# Patient Record
Sex: Female | Born: 1970 | Race: White | Hispanic: No | Marital: Married | Smoking: Never smoker
Health system: Southern US, Community
[De-identification: ages and names within clinical notes are randomized; demographics above are authoritative.]

## PROBLEM LIST (undated history)

## (undated) DIAGNOSIS — J302 Other seasonal allergic rhinitis: Secondary | ICD-10-CM

## (undated) DIAGNOSIS — R51 Headache: Secondary | ICD-10-CM

## (undated) HISTORY — PX: NO PAST SURGERIES: SHX2092

---

## 1998-05-26 ENCOUNTER — Other Ambulatory Visit: Admission: RE | Admit: 1998-05-26 | Discharge: 1998-05-26 | Payer: Self-pay | Admitting: Obstetrics and Gynecology

## 1999-05-31 ENCOUNTER — Other Ambulatory Visit: Admission: RE | Admit: 1999-05-31 | Discharge: 1999-05-31 | Payer: Self-pay | Admitting: Obstetrics and Gynecology

## 2000-06-06 ENCOUNTER — Other Ambulatory Visit: Admission: RE | Admit: 2000-06-06 | Discharge: 2000-06-06 | Payer: Self-pay | Admitting: Obstetrics and Gynecology

## 2001-06-07 ENCOUNTER — Other Ambulatory Visit: Admission: RE | Admit: 2001-06-07 | Discharge: 2001-06-07 | Payer: Self-pay | Admitting: Obstetrics and Gynecology

## 2008-04-10 ENCOUNTER — Ambulatory Visit (HOSPITAL_COMMUNITY): Admission: RE | Admit: 2008-04-10 | Discharge: 2008-04-10 | Payer: Self-pay | Admitting: Obstetrics and Gynecology

## 2008-07-03 ENCOUNTER — Ambulatory Visit (HOSPITAL_COMMUNITY): Admission: RE | Admit: 2008-07-03 | Discharge: 2008-07-03 | Payer: Self-pay | Admitting: Obstetrics and Gynecology

## 2010-10-09 IMAGING — RF DG HYSTEROGRAM
9 series · 9 of 9 positions shown · IV contrast (omnipaque)
Comparison: none

CLINICAL DATA: Status post Essure micro-insert placement for
contraception.  Evaluate micro-insert location and tubal patency.

HYSTEROSALPINGOGRAM
TECHNIQUE: Following cleansing of the cervix and vagina with
Betadine solution, a hysterosalpingogram was performed using a 5-
French hysterosalpingogram catheter and Omnipaque 300 contrast. The
patient tolerated the examination without difficulty.
Fluoroscopy time: 1.1 minutes

[Series 1: run · 1 of 1 slices shown (1 of 9)]
[im 1/1]
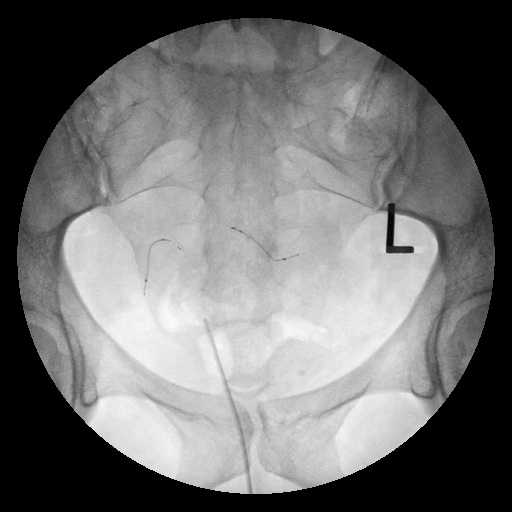

[Series 2: run · 1 of 1 slices shown (2 of 9)]
[im 1/1]
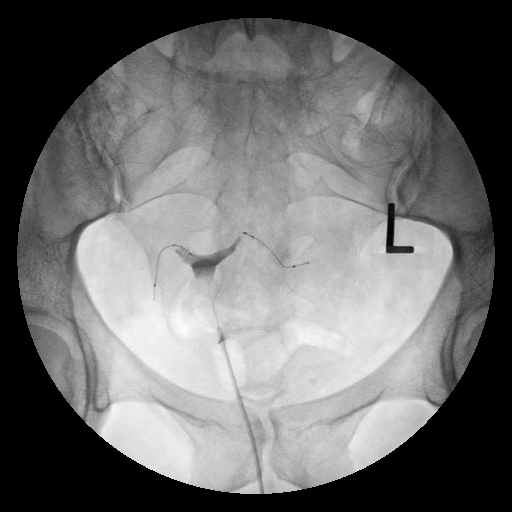

[Series 3: run · 1 of 1 slices shown (3 of 9)]
[im 1/1]
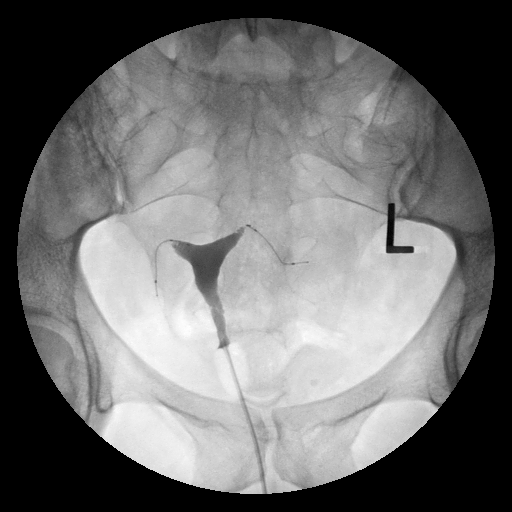

[Series 4: run · 1 of 1 slices shown (4 of 9)]
[im 1/1]
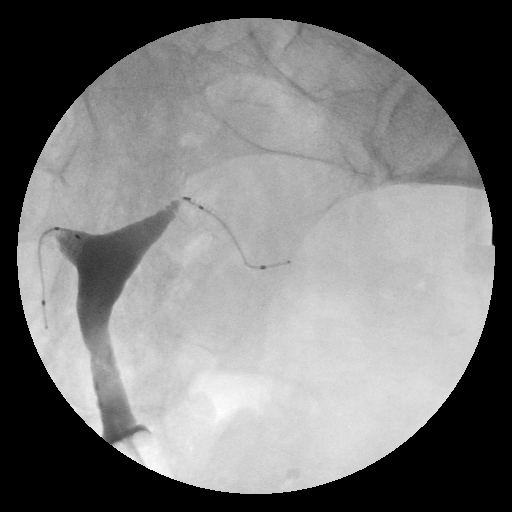

[Series 5: run · 1 of 1 slices shown (5 of 9)]
[im 1/1]
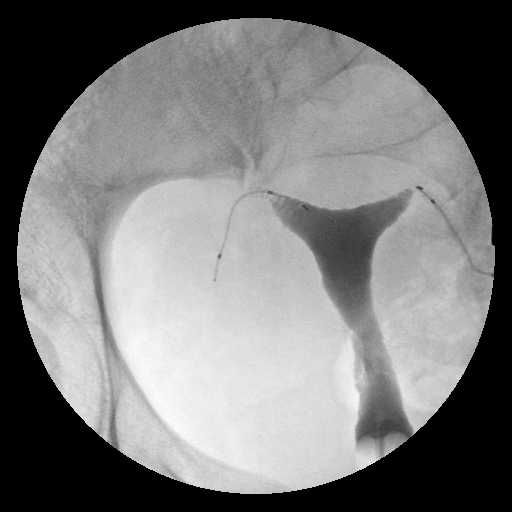

[Series 6: run · 1 of 1 slices shown (6 of 9)]
[im 1/1]
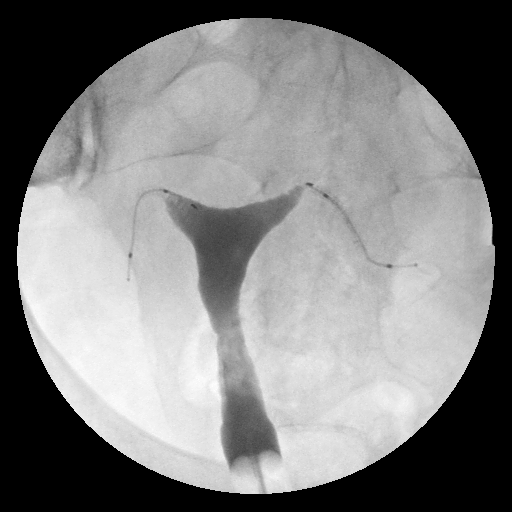

[Series 7: run · 1 of 1 slices shown (7 of 9)]
[im 1/1]
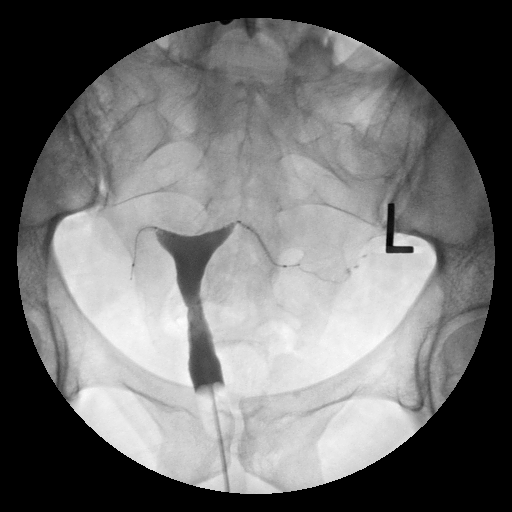

[Series 8: run · 1 of 1 slices shown (8 of 9)]
[im 1/1]
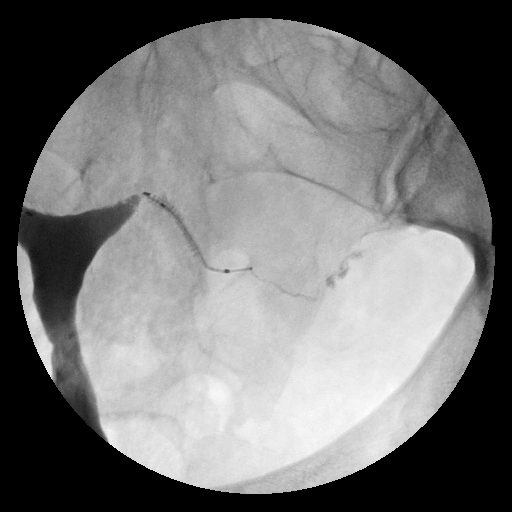

[Series 9: run · 1 of 1 slices shown (9 of 9)]
[im 1/1]
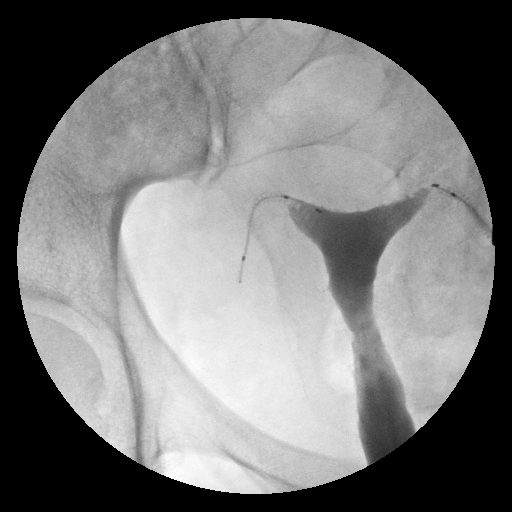

[9 of 9 positions shown; findings below may reference images not displayed]

FINDINGS: The Essure microinserts are seen in satisfactory
location within both of the fallopian tubes.

No contrast is seen opacifying the right fallopian tube.

Contrast opacification of the left fallopian tube is seen, with
contrast extending beyond the distal end of the micro-
insert(category 3).
IMPRESSION: 1.  Both Essure micro-inserts are in satisfactory location.
2.  Left fallopian tube is patent beyond the micro-insert (category
3).

This report was discussed with the secretary in Dr. [REDACTED] by telephone on 04/10/2008 at 5577 hours, as Dr. Katty
was not available, and this report was faxed to his office.

## 2010-12-21 ENCOUNTER — Other Ambulatory Visit: Payer: Self-pay

## 2010-12-21 ENCOUNTER — Encounter (HOSPITAL_BASED_OUTPATIENT_CLINIC_OR_DEPARTMENT_OTHER): Payer: Self-pay | Admitting: *Deleted

## 2010-12-21 ENCOUNTER — Inpatient Hospital Stay (HOSPITAL_BASED_OUTPATIENT_CLINIC_OR_DEPARTMENT_OTHER)
Admission: RE | Admit: 2010-12-21 | Discharge: 2010-12-21 | Disposition: A | Discharge status: Home / Self Care | Payer: BC Managed Care – PPO | Source: Ambulatory Visit

## 2010-12-21 LAB — BASIC METABOLIC PANEL
BUN: 5 mg/dL — ABNORMAL LOW (ref 6–23)
GFR calc Af Amer: >90 mL/min (ref >90)
GFR calc non Af Amer: >90 mL/min (ref >90)
Potassium: 3.5 mEq/L (ref 3.5–5.1)
Sodium: 139 mEq/L (ref 135–145)

## 2010-12-21 NOTE — Progress Notes (Signed)
Pt's PCP Deboraha Sprang at [facility]       [phone]

## 2010-12-22 ENCOUNTER — Other Ambulatory Visit: Payer: Self-pay | Admitting: Surgery

## 2010-12-22 NOTE — H&P (Addendum)
  RE: Carrie Humphrey, Carrie Humphrey   [id number]      DOB: 09-Dec-1970 PROGRESS NOTE: 11-09-10 [name] is a 40 year old presents for evaluation and treatment recommendation for right ankle. She had a ankle fracture 10 years ago treated closed with a Cam Walker. She did well went on to heal and her ankle has done well. She's had intermittent mechanical symptoms laterally. Lately this has gotten worse to a point that she'll go into plantar flexion her foot will lock and she has to maneuver it to get it back with motion and allow it to dorsiflex. Symptoms are anterolateral. No giving way or swelling. In between episodes of locking she's done well without arthritic symptoms. No symptoms straight lateral or straight medial.  Remaining history is updated included in the chart. General exam is outlined included in the chart.   EXAMINATION: Healthy 40 year old 5'2" 150 pounds. Normal gait and stance. Right ankle has tenderness anterolaterally. You can feel thickened synovium or loose body anterolaterally with dorsiflexion and plantarflexion. A little sore over the syndesmosis. Does not open to tilt or drawer or with external rotation. No tenderness medially. No subluxation of her peroneal tendons laterally. Achilles intact. She's neurovascularly intact distally.  A little effusion. Left ankle has full motion good stability neurovascularly intact.  X-RAYS: Right ankle shows old ossification calcification at the medial malleolus from the soft tissue injury when she broke her ankle. No other evidence of disruption from previous fracture. Alignment is good mortise well maintained. There is an ossific loose body anterolaterally on the AP view it looks like it's almost into the syndesmosis but I think this is a free loose body within the ankle. I'm not seeing an obvious bed of osteochondral injury. No significant spurring.  IMPRESSION: Posttraumatic osteochondral loose body right ankle.   DISPOSITION: To delineate all structures in  the ankle and look for potential site of osteochondral injury that may require debridement and drilling I'm going to get an MRI. Sufficient symptoms to warrant treatment and we discussed that. All paperwork is complete all questions answered for exam under anesthesia arthroscopy with removal of loose body. I'll be in touch with her after MRI is complete to be sure I'm not seeing anything else that needs to be addressed.  Loreta Ave, M.D.  Patient seen and examined. No changes, proceed with Right ankle scope/debride/loose body/ possible microfx

## 2010-12-23 ENCOUNTER — Encounter (HOSPITAL_BASED_OUTPATIENT_CLINIC_OR_DEPARTMENT_OTHER): Payer: Self-pay | Admitting: *Deleted

## 2010-12-23 ENCOUNTER — Ambulatory Visit (HOSPITAL_BASED_OUTPATIENT_CLINIC_OR_DEPARTMENT_OTHER)
Admission: RE | Admit: 2010-12-23 | Discharge: 2010-12-23 | Disposition: A | Discharge status: Home / Self Care | Payer: BC Managed Care – PPO | Source: Ambulatory Visit | Attending: Orthopedic Surgery | Admitting: Orthopedic Surgery

## 2010-12-23 ENCOUNTER — Encounter (HOSPITAL_BASED_OUTPATIENT_CLINIC_OR_DEPARTMENT_OTHER)
Admission: RE | Disposition: A | Discharge status: Home / Self Care | Payer: Self-pay | Source: Ambulatory Visit | Attending: Orthopedic Surgery

## 2010-12-23 ENCOUNTER — Ambulatory Visit (HOSPITAL_BASED_OUTPATIENT_CLINIC_OR_DEPARTMENT_OTHER): Payer: BC Managed Care – PPO | Admitting: *Deleted

## 2010-12-23 DIAGNOSIS — M2408 Loose body, other site: Secondary | ICD-10-CM | POA: Insufficient documentation

## 2010-12-23 DIAGNOSIS — R51 Headache: Secondary | ICD-10-CM | POA: Insufficient documentation

## 2010-12-23 DIAGNOSIS — M24073 Loose body in unspecified ankle: Secondary | ICD-10-CM | POA: Insufficient documentation

## 2010-12-23 DIAGNOSIS — Z01812 Encounter for preprocedural laboratory examination: Secondary | ICD-10-CM | POA: Insufficient documentation

## 2010-12-23 DIAGNOSIS — Z01810 Encounter for preprocedural cardiovascular examination: Secondary | ICD-10-CM | POA: Insufficient documentation

## 2010-12-23 DIAGNOSIS — M959 Acquired deformity of musculoskeletal system, unspecified: Secondary | ICD-10-CM | POA: Insufficient documentation

## 2010-12-23 DIAGNOSIS — Z4789 Encounter for other orthopedic aftercare: Secondary | ICD-10-CM

## 2010-12-23 DIAGNOSIS — E119 Type 2 diabetes mellitus without complications: Secondary | ICD-10-CM | POA: Insufficient documentation

## 2010-12-23 HISTORY — PX: ANKLE ARTHROSCOPY: SHX545

## 2010-12-23 HISTORY — DX: Other seasonal allergic rhinitis: J30.2

## 2010-12-23 HISTORY — DX: Headache: R51

## 2010-12-23 SURGERY — ARTHROSCOPY, ANKLE
Anesthesia: General | Site: Ankle | Laterality: Right | Wound class: Clean

## 2010-12-23 MED ORDER — LACTATED RINGERS IV SOLN: INTRAVENOUS | Status: DC

## 2010-12-23 MED ORDER — ROPIVACAINE HCL 5 MG/ML IJ SOLN
INTRAMUSCULAR | Status: DC | PRN
  Administered 2010-12-23: 20 mL via EPIDURAL

## 2010-12-23 MED ORDER — ONDANSETRON HCL 4 MG/2ML IJ SOLN
INTRAMUSCULAR | Status: DC | PRN
  Administered 2010-12-23: 4 mg via INTRAVENOUS

## 2010-12-23 MED ORDER — MORPHINE SULFATE 2 MG/ML IJ SOLN: 0.0500 mg/kg | INTRAMUSCULAR | Status: DC | PRN

## 2010-12-23 MED ORDER — PROPOFOL 10 MG/ML IV EMUL
INTRAVENOUS | Status: DC | PRN
  Administered 2010-12-23: 200 mg via INTRAVENOUS

## 2010-12-23 MED ORDER — ATROPINE SULFATE 0.4 MG/ML IJ SOLN: 0.4000 mg | Freq: Once | INTRAMUSCULAR | Status: DC | PRN

## 2010-12-23 MED ORDER — VANCOMYCIN HCL IN DEXTROSE 1-5 GM/200ML-% IV SOLN
1000.0000 mg | INTRAVENOUS | Status: AC
  Administered 2010-12-23: 1000 mg via INTRAVENOUS

## 2010-12-23 MED ORDER — LIDOCAINE-PRILOCAINE 2.5-2.5 % EX CREA: 1.0000 "application " | TOPICAL_CREAM | Freq: Once | CUTANEOUS | Status: DC

## 2010-12-23 MED ORDER — FENTANYL CITRATE 0.05 MG/ML IJ SOLN
50.0000 ug | INTRAMUSCULAR | Status: DC | PRN
  Administered 2010-12-23: 100 ug via INTRAVENOUS

## 2010-12-23 MED ORDER — GLYCOPYRROLATE 0.2 MG/ML IJ SOLN: 0.2000 mg | Freq: Once | INTRAMUSCULAR | Status: DC | PRN

## 2010-12-23 MED ORDER — LIDOCAINE HCL (CARDIAC) 20 MG/ML IV SOLN
INTRAVENOUS | Status: DC | PRN
  Administered 2010-12-23: 25 mg via INTRAVENOUS
  Administered 2010-12-23: 1 mg via INTRAVENOUS

## 2010-12-23 MED ORDER — LACTATED RINGERS IV SOLN
INTRAVENOUS | Status: DC
  Administered 2010-12-23 (×2): via INTRAVENOUS

## 2010-12-23 MED ORDER — LACTATED RINGERS IV SOLN: 500.0000 mL | INTRAVENOUS | Status: DC

## 2010-12-23 MED ORDER — MIDAZOLAM HCL 2 MG/2ML IJ SOLN
0.5000 mg | INTRAMUSCULAR | Status: DC | PRN
  Administered 2010-12-23: 2 mg via INTRAVENOUS

## 2010-12-23 MED ORDER — METOCLOPRAMIDE HCL 5 MG/ML IJ SOLN: 10.0000 mg | Freq: Once | INTRAMUSCULAR | Status: DC | PRN

## 2010-12-23 MED ORDER — FENTANYL CITRATE 0.05 MG/ML IJ SOLN: 25.0000 ug | INTRAMUSCULAR | Status: DC | PRN

## 2010-12-23 MED ORDER — CEFAZOLIN SODIUM 1-5 GM-% IV SOLN: 1.0000 g | INTRAVENOUS | Status: DC

## 2010-12-23 MED ORDER — IBUPROFEN 200 MG PO TABS: 200.0000 mg | ORAL_TABLET | Freq: Four times a day (QID) | ORAL | Status: DC | PRN

## 2010-12-23 MED ORDER — KETOROLAC TROMETHAMINE 30 MG/ML IJ SOLN
15.0000 mg | Freq: Once | INTRAMUSCULAR | Status: AC | PRN
  Administered 2010-12-23: 30 mg via INTRAVENOUS

## 2010-12-23 MED ORDER — DEXAMETHASONE SODIUM PHOSPHATE 4 MG/ML IJ SOLN
INTRAMUSCULAR | Status: DC | PRN
  Administered 2010-12-23: 5 mg via INTRAVENOUS

## 2010-12-23 MED ORDER — MIDAZOLAM HCL 2 MG/2ML IJ SOLN: 1.0000 mg | INTRAMUSCULAR | Status: DC | PRN

## 2010-12-23 MED ORDER — LIDOCAINE-EPINEPHRINE 1.5-1:200000 % IJ SOLN
INTRAMUSCULAR | Status: DC | PRN
  Administered 2010-12-23: 20 mL via INTRADERMAL

## 2010-12-23 MED ORDER — OXYMETAZOLINE HCL 0.05 % NA SOLN: 2.0000 | Freq: Once | NASAL | Status: DC

## 2010-12-23 SURGICAL SUPPLY — 51 items
BANDAGE ELASTIC 4 VELCRO ST LF (GAUZE/BANDAGES/DRESSINGS) ×2 IMPLANT
BLADE 4.2CUDA (BLADE) IMPLANT
BLADE CUDA GRT WHITE 3.5 (BLADE) IMPLANT
BLADE CUDA SHAVER 3.5 (BLADE) ×1 IMPLANT
BLADE CUTTER GATOR 3.5 (BLADE) IMPLANT
BLADE GREAT WHITE 4.2 (BLADE) IMPLANT
BNDG CMPR 9X4 STRL LF SNTH (GAUZE/BANDAGES/DRESSINGS) ×1
BNDG ESMARK 4X9 LF (GAUZE/BANDAGES/DRESSINGS) ×2 IMPLANT
BUR CUDA 2.9 (BURR) IMPLANT
BUR FULL RADIUS 2.9 (BURR) ×1 IMPLANT
BUR OVAL 4.0 (BURR) IMPLANT
BUR OVAL 6.0 (BURR) IMPLANT
CANISTER OMNI JUG 16 LITER (MISCELLANEOUS) IMPLANT
CANISTER SUCTION 2500CC (MISCELLANEOUS) IMPLANT
CLOTH BEACON ORANGE TIMEOUT ST (SAFETY) ×2 IMPLANT
CUFF TOURNIQUET SINGLE 34IN LL (TOURNIQUET CUFF) IMPLANT
CUTTER MENISCUS  4.2MM (BLADE)
CUTTER MENISCUS 4.2MM (BLADE) IMPLANT
DECANTER SPIKE VIAL GLASS SM (MISCELLANEOUS) IMPLANT
DRAPE ARTHROSCOPY W/POUCH 90 (DRAPES) ×2 IMPLANT
DRAPE OEC MINIVIEW 54X84 (DRAPES) IMPLANT
DRAPE SURG 17X23 STRL (DRAPES) ×2 IMPLANT
DRSG EMULSION OIL 3X3 NADH (GAUZE/BANDAGES/DRESSINGS) ×1 IMPLANT
DURAPREP 26ML APPLICATOR (WOUND CARE) ×2 IMPLANT
ELECT MENISCUS 165MM 90D (ELECTRODE) IMPLANT
ELECT REM PT RETURN 9FT ADLT (ELECTROSURGICAL)
ELECTRODE REM PT RTRN 9FT ADLT (ELECTROSURGICAL) ×1 IMPLANT
GAUZE XEROFORM 1X8 LF (GAUZE/BANDAGES/DRESSINGS) ×2 IMPLANT
GLOVE BIOGEL PI IND STRL 8 (GLOVE) ×1 IMPLANT
GLOVE BIOGEL PI INDICATOR 8 (GLOVE) ×1
GLOVE ORTHO TXT STRL SZ7.5 (GLOVE) ×4 IMPLANT
GOWN BRE IMP PREV XXLGXLNG (GOWN DISPOSABLE) ×2 IMPLANT
GOWN PREVENTION PLUS XLARGE (GOWN DISPOSABLE) ×2 IMPLANT
IV NS IRRIG 3000ML ARTHROMATIC (IV SOLUTION) ×1 IMPLANT
NDL KEITH (NEEDLE) IMPLANT
NEEDLE KEITH (NEEDLE) IMPLANT
PACK ARTHROSCOPY DSU (CUSTOM PROCEDURE TRAY) ×2 IMPLANT
PACK BASIN DAY SURGERY FS (CUSTOM PROCEDURE TRAY) ×2 IMPLANT
PADDING WEBRIL 4 STERILE (GAUZE/BANDAGES/DRESSINGS) ×1 IMPLANT
PENCIL BUTTON HOLSTER BLD 10FT (ELECTRODE) IMPLANT
SET ARTHROSCOPY TUBING (MISCELLANEOUS) ×2
SET ARTHROSCOPY TUBING LN (MISCELLANEOUS) ×1 IMPLANT
SLEEVE SCD COMPRESS KNEE MED (MISCELLANEOUS) IMPLANT
SPONGE GAUZE 4X4 12PLY (GAUZE/BANDAGES/DRESSINGS) ×4 IMPLANT
STOCKINETTE 4X48 STRL (DRAPES) ×2 IMPLANT
STRAP ANKLE FOOT DISTRACTOR (ORTHOPEDIC SUPPLIES) IMPLANT
SUT ETHILON 3 0 PS 1 (SUTURE) ×2 IMPLANT
SUT VIC AB 3-0 SH 27 (SUTURE)
SUT VIC AB 3-0 SH 27X BRD (SUTURE) IMPLANT
TUBE CONNECTING 20X1/4 (TUBING) IMPLANT
WATER STERILE IRR 1000ML POUR (IV SOLUTION) ×2 IMPLANT

## 2010-12-23 NOTE — Anesthesia Preprocedure Evaluation (Addendum)
Anesthesia Evaluation  Patient identified by MRN, date of birth, ID band Patient awake    Reviewed: Allergy & Precautions, H&P , NPO status , Patient's Chart, lab work & pertinent test results, reviewed documented beta blocker date and time   Airway Mallampati: II TM Distance: >3 FB Neck ROM: full    Dental   Pulmonary neg pulmonary ROS,    Pulmonary exam normal       Cardiovascular neg cardio ROS     Neuro/Psych  Headaches, Negative Psych ROS   GI/Hepatic negative GI ROS, Neg liver ROS,   Endo/Other  Negative Endocrine ROSDiabetes mellitus-  Renal/GU negative Renal ROS  Genitourinary negative   Musculoskeletal   Abdominal   Peds  Hematology negative hematology ROS (+)   Anesthesia Other Findings   Reproductive/Obstetrics negative OB ROS                           Anesthesia Physical Anesthesia Plan  ASA: II  Anesthesia Plan: General   Post-op Pain Management: MAC Combined w/ Regional for Post-op pain   Induction: Intravenous  Airway Management Planned: LMA  Additional Equipment:   Intra-op Plan:   Post-operative Plan:   Informed Consent: I have reviewed the patients History and Physical, chart, labs and discussed the procedure including the risks, benefits and alternatives for the proposed anesthesia with the patient or authorized representative who has indicated his/her understanding and acceptance.   Dental Advisory Given  Plan Discussed with: CRNA and Surgeon  Anesthesia Plan Comments: (See surgeon's H&P )      Anesthesia Quick Evaluation

## 2010-12-23 NOTE — Brief Op Note (Signed)
12/23/2010  11:01 AM  PATIENT:  Carrie Humphrey  40 y.o. female  PRE-OPERATIVE DIAGNOSIS:  loose body right ankle  POST-OPERATIVE DIAGNOSIS:  loose body right ankle  PROCEDURE:  Procedure(s): ANKLE ARTHROSCOPY ANKLE ARTHROSCOPY WITH DRILLING/MICROFRACTURE  SURGEON:  Surgeon(s): Loreta Ave, MD  PHYSICIAN ASSISTANT: Zonia Kief M     ANESTHESIA:   general  EBL:  Total I/O In: 1200 [I.V.:1200] Out: -       LOCAL MEDICATIONS USED:  NONE  SPECIMEN:  No Specimen  DISPOSITION OF SPECIMEN:  N/A  COUNTS:  YES  TOURNIQUET:   Total Tourniquet Time Documented: Thigh (Right) - 43 minutes      PATIENT DISPOSITION:  PACU - hemodynamically stable.

## 2010-12-23 NOTE — Anesthesia Procedure Notes (Addendum)
Anesthesia Regional Block:  Popliteal block  Pre-Anesthetic Checklist: ,, timeout performed, Correct Patient, Correct Site, Correct Laterality, Correct Procedure, Correct Position, site marked, Risks and benefits discussed,  Surgical consent,  Pre-op evaluation,  At surgeon's request and post-op pain management  Laterality: Right  Prep: chloraprep       Needles:   Needle Type: Other   (Arrow Echogenic)   Needle Length: 9cm  Needle Gauge: 21    Additional Needles:  Procedures: ultrasound guided Popliteal block Narrative:  Start time: 12/23/2010 9:10 AM End time: 12/23/2010 9:20 AM Injection made incrementally with aspirations every 5 mL.  Performed by: Personally  Anesthesiologist: cfrederick  Additional Notes: Ultrasound guidance used to: id relevant anatomy, confirm needle position, local anesthetic spread, avoidance of vascular puncture. Picture saved. No complications.    Popliteal block Procedure Name: LMA Insertion Performed by: Meyer Russel Pre-anesthesia Checklist: Patient identified, Emergency Drugs available, Suction available and Patient being monitored Patient Re-evaluated:Patient Re-evaluated prior to inductionOxygen Delivery Method: Circle System Utilized Preoxygenation: Pre-oxygenation with 100% oxygen Intubation Type: IV induction Ventilation: Mask ventilation without difficulty LMA: LMA inserted LMA Size: 4.0 Number of attempts: 1 Airway Equipment and Method: bite block Placement Confirmation: positive ETCO2 Tube secured with: Tape Dental Injury: Teeth and Oropharynx as per pre-operative assessment     Procedure Name: LMA Insertion Date/Time: 12/23/2010 9:52 AM Performed by: Meyer Russel Pre-anesthesia Checklist: Patient identified, Emergency Drugs available, Suction available, Patient being monitored and Timeout performed Patient Re-evaluated:Patient Re-evaluated prior to inductionOxygen Delivery Method: Circle System  Utilized Preoxygenation: Pre-oxygenation with 100% oxygen Intubation Type: IV induction Ventilation: Mask ventilation without difficulty LMA: LMA inserted LMA Size: 3.0 Number of attempts: 1 Airway Equipment and Method: bite block Placement Confirmation: positive ETCO2 Tube secured with: Tape Dental Injury: Teeth and Oropharynx as per pre-operative assessment

## 2010-12-23 NOTE — Transfer of Care (Signed)
Immediate Anesthesia Transfer of Care Note  Patient: Carrie Humphrey  Procedure(s) Performed:  ANKLE ARTHROSCOPY; ANKLE ARTHROSCOPY WITH DRILLING/MICROFRACTURE  Patient Location: PACU  Anesthesia Type: GA combined with regional for post-op pain  Level of Consciousness: awake, alert  and patient cooperative  Airway & Oxygen Therapy: Patient Spontanous Breathing and Patient connected to face mask oxygen  Post-op Assessment: Report given to PACU RN, Post -op Vital signs reviewed and stable and Patient moving all extremities X 4  Post vital signs: Reviewed and stable  Complications: No apparent anesthesia complications

## 2010-12-23 NOTE — Anesthesia Postprocedure Evaluation (Signed)
  Anesthesia Post-op Note  Patient: Carrie Humphrey  Procedure(s) Performed:  ANKLE ARTHROSCOPY; ANKLE ARTHROSCOPY WITH DRILLING/MICROFRACTURE  Patient Location: PACU  Anesthesia Type: General  Level of Consciousness: awake  Airway and Oxygen Therapy: Patient Spontanous Breathing  Post-op Pain: none  Post-op Assessment: Post-op Vital signs reviewed, Patient's Cardiovascular Status Stable, Respiratory Function Stable, Patent Airway, No signs of Nausea or vomiting, Adequate PO intake and Pain level controlled  Post-op Vital Signs: Reviewed and stable  Complications: No apparent anesthesia complications

## 2010-12-24 NOTE — Op Note (Signed)
NAMESAMIYAH, STUPKA NO.:  [id number]  MEDICAL RECORD NO.:  [id number]  LOCATION:                                 FACILITY:  PHYSICIAN:  Loreta Ave, M.D.      DATE OF BIRTH:  DATE OF PROCEDURE:  12/23/2010 DATE OF DISCHARGE:                              OPERATIVE REPORT   PREOPERATIVE DIAGNOSES:  Right ankle status post soft tissue sprain and medial malleolus fracture tread close.  Healed.  Now with an osteochondral fracture lateral talar dome and at least 1 loose body.  POSTOPERATIVE DIAGNOSES:  Right ankle status post soft tissue sprain and medial malleolus fracture tread close.  Healed.  Now with an osteochondral fracture lateral talar dome with 1 large osteochondral loose body.  Numerous other small ones.  Reactive synovitis.  Moderate lateral laxity.  Grade 4 osteochondral lesion, lateral talar dome.  PROCEDURE:  Right ankle exam under anesthesia, arthroscopy.  Partial synovectomy.  Removal of loose bodies.  Chondroplasty and microfracturing, lateral talar dome.  SURGEON:  Loreta Ave, MD  ASSISTANT:  Genene Churn. Barry Dienes, [place]  ANESTHESIA:  General.  BLOOD LOSS:  Minimal.  SPECIMENS:  None.  COUNTS:  None.  COMPLICATION:  None.  DRESSINGS:  Soft compressive.  TOURNIQUET TIME:  One hour.  PROCEDURE:  The patient was brought to the operating room, placed on the operating table in supine position.  After adequate anesthesia had been obtained, tourniquet applied to right thigh.  Prepped and draped in usual sterile fashion.  Exsanguinated with elevation and Esmarch. Tourniquet inflated to 350 mmHg.  The appropriate leg holder for ankle arthroscopy utilized.  Ankle examined with fluoroscopic guidance.  Loose body was seen anterolaterally with fluoroscopy.  Talar tilt revealed tilt for about 15 degrees on the lateral side.  Also positive drawer. Two portals, anterolateral and anteromedial.  Arthroscope introduced, ankle distended and  inspected.  One large loose body found laterally removed.  Numerous other small ones removed.  One partially tether the bottom medial malleolus, freed up and removed.  Grade 4 lesion lateral talar dome debrided to healthy tissue.  This is less than a centimeter in diameter, a little bit more posterior.  Treated with microfracturing, fluid pressure reduced to confirm adequate bleeding.  Another lesion in the very front of the lateral talar dome with loose body have intermittently entrapped also with a small grade 4 lesion less than a centimeter in diameter debrided and treated with multiple microfracturing as well.  When that was complete, the entire ankle examined.  All other structures looked good.  All other articular cartilage was intact.  Instruments and fluids were removed.  Portals were closed with nylon.  Sterile compressive dressing applied. Tourniquet deflated and removed.  Anesthesia reversed.  Brought to recovery room.  Tolerated surgery well.  No complications.     Loreta Ave, M.D.     DFM/MEDQ  D:  12/23/2010  T:  12/23/2010  Job:  [id number]

## 2011-01-04 ENCOUNTER — Encounter (HOSPITAL_BASED_OUTPATIENT_CLINIC_OR_DEPARTMENT_OTHER): Payer: Self-pay | Admitting: Orthopedic Surgery

## 2013-06-19 ENCOUNTER — Ambulatory Visit (INDEPENDENT_AMBULATORY_CARE_PROVIDER_SITE_OTHER): Payer: BC Managed Care – PPO

## 2013-06-19 ENCOUNTER — Ambulatory Visit (INDEPENDENT_AMBULATORY_CARE_PROVIDER_SITE_OTHER): Payer: BC Managed Care – PPO | Admitting: Podiatry

## 2013-06-19 ENCOUNTER — Encounter: Payer: Self-pay | Admitting: Podiatry

## 2013-06-19 VITALS — BP 101/70 | HR 88 | Resp 12

## 2013-06-19 DIAGNOSIS — R52 Pain, unspecified: Secondary | ICD-10-CM

## 2013-06-19 DIAGNOSIS — L03039 Cellulitis of unspecified toe: Secondary | ICD-10-CM

## 2013-06-19 DIAGNOSIS — M898X9 Other specified disorders of bone, unspecified site: Secondary | ICD-10-CM

## 2013-06-19 NOTE — Progress Notes (Signed)
   Subjective:    Patient ID: Carrie Humphrey, female    DOB: 08/28/1970, 43 y.o.   MRN: [id number]  HPI PT STATED RT FOOT GREAT TOE IS SORE FOR 2 WEEKS. THE TOE IS GETTING BETTER. THE TOE GET AGGRAVATED BY PRESSURE. TRIED NO TREATMENT.    Review of Systems  All other systems reviewed and are negative.      Objective:   Physical Exam        Assessment & Plan:

## 2013-06-20 NOTE — Progress Notes (Signed)
Subjective:     Patient ID: Carrie ChalkSabrena Potocki, female   DOB: 01/05/1971, 43 y.o.   MRN: [id number]  Toe Pain    patient presents stating she is having trouble with her right big toenail and is concerned that there may be an infection or also in her toe and she does have diabetes   Review of Systems  All other systems reviewed and are negative.      Objective:   Physical Exam  Nursing note and vitals reviewed. Constitutional: She is oriented to person, place, and time.  Cardiovascular: Intact distal pulses.   Musculoskeletal: Normal range of motion.  Neurological: She is oriented to person, place, and time.  Skin: Skin is warm.   neurovascular status is intact with muscle strength adequate and range of motion subtalar midtarsal joint within normal limits. Digits are found to be well perfused and arch height is normal with no bare callosities noted. Right hallux lateral border is slightly inflamed with no drainage noted and there is some irritation within the big toe itself with no redness or no erythema noted     Assessment:     Possible mild paronychia infection right hallux versus irritation to the digit itself    Plan:

## 2022-11-25 LAB — COLOGUARD: COLOGUARD: NEGATIVE
